# Patient Record
Sex: Female | Born: 1975 | Race: White | Hispanic: No | Marital: Married | Smoking: Never smoker
Health system: Southern US, Community
[De-identification: ages and names within clinical notes are randomized; demographics above are authoritative.]

## PROBLEM LIST (undated history)

## (undated) DIAGNOSIS — F419 Anxiety disorder, unspecified: Secondary | ICD-10-CM

## (undated) DIAGNOSIS — N3010 Interstitial cystitis (chronic) without hematuria: Secondary | ICD-10-CM

## (undated) DIAGNOSIS — R51 Headache: Secondary | ICD-10-CM

## (undated) DIAGNOSIS — E039 Hypothyroidism, unspecified: Secondary | ICD-10-CM

## (undated) HISTORY — DX: Interstitial cystitis (chronic) without hematuria: N30.10

---

## 1998-07-20 ENCOUNTER — Other Ambulatory Visit: Admission: RE | Admit: 1998-07-20 | Discharge: 1998-07-20 | Payer: Self-pay | Admitting: *Deleted

## 2000-03-07 ENCOUNTER — Other Ambulatory Visit: Admission: RE | Admit: 2000-03-07 | Discharge: 2000-03-07 | Payer: Self-pay | Admitting: Obstetrics and Gynecology

## 2000-10-09 ENCOUNTER — Inpatient Hospital Stay (HOSPITAL_COMMUNITY): Admission: RE | Admit: 2000-10-09 | Discharge: 2000-10-12 | Payer: Self-pay | Admitting: Obstetrics and Gynecology

## 2000-11-06 ENCOUNTER — Other Ambulatory Visit: Admission: RE | Admit: 2000-11-06 | Discharge: 2000-11-06 | Payer: Self-pay | Admitting: Obstetrics and Gynecology

## 2000-11-14 ENCOUNTER — Encounter: Payer: Self-pay | Admitting: Obstetrics and Gynecology

## 2000-11-14 ENCOUNTER — Encounter: Admission: RE | Admit: 2000-11-14 | Discharge: 2000-11-14 | Payer: Self-pay | Admitting: Obstetrics and Gynecology

## 2001-11-12 ENCOUNTER — Other Ambulatory Visit: Admission: RE | Admit: 2001-11-12 | Discharge: 2001-11-12 | Payer: Self-pay | Admitting: Obstetrics and Gynecology

## 2002-11-15 ENCOUNTER — Other Ambulatory Visit: Admission: RE | Admit: 2002-11-15 | Discharge: 2002-11-15 | Payer: Self-pay | Admitting: Obstetrics and Gynecology

## 2012-01-24 ENCOUNTER — Other Ambulatory Visit: Payer: Self-pay | Admitting: Obstetrics and Gynecology

## 2012-01-30 ENCOUNTER — Encounter (HOSPITAL_COMMUNITY): Payer: Self-pay | Admitting: Pharmacist

## 2012-02-01 ENCOUNTER — Encounter (HOSPITAL_COMMUNITY): Payer: Self-pay

## 2012-02-01 ENCOUNTER — Encounter (HOSPITAL_COMMUNITY)
Admission: RE | Admit: 2012-02-01 | Discharge: 2012-02-01 | Disposition: A | Discharge status: Home / Self Care | Payer: 59 | Source: Ambulatory Visit | Attending: Obstetrics and Gynecology | Admitting: Obstetrics and Gynecology

## 2012-02-01 HISTORY — DX: Headache: R51

## 2012-02-01 HISTORY — DX: Hypothyroidism, unspecified: E03.9

## 2012-02-01 HISTORY — DX: Anxiety disorder, unspecified: F41.9

## 2012-02-01 LAB — SURGICAL PCR SCREEN
MRSA, PCR: NEGATIVE
Staphylococcus aureus: NEGATIVE

## 2012-02-01 LAB — CBC
Hemoglobin: 13.8 g/dL (ref 12.0–15.0)
Platelets: 168 10*3/uL (ref 150–400)
RBC: 4.3 MIL/uL (ref 3.87–5.11)
WBC: 5.7 10*3/uL (ref 4.0–10.5)

## 2012-02-01 NOTE — Patient Instructions (Addendum)
20 Robin Zavala  02/01/2012   Your procedure is scheduled on:  02/03/12  Enter through the Main Entrance of [facility] at 1:30 PM  Pick up the phone at the desk and dial [phone].   Call this number if you have problems the morning of surgery: [phone]   Remember:   Do not eat food:After Midnight.  Do not drink clear liquids: 4 Hours before arrival.  Take these medicines the morning of surgery with A SIP OF WATER: Thyroid medication   Do not wear jewelry, make-up or nail polish.  Do not wear lotions, powders, or perfumes. You may wear deodorant.  Do not shave 48 hours prior to surgery.  Do not bring valuables to the hospital.  Contacts, dentures or bridgework may not be worn into surgery.  Leave suitcase in the car. After surgery it may be brought to your room.  For patients admitted to the hospital, checkout time is 11:00 AM the day of discharge.   Patients discharged the day of surgery will not be allowed to drive home.  Name and phone number of your driver: husband    Robin Zavala  Special Instructions: Shower using CHG 2 nights before surgery and the night before surgery.  If you shower the day of surgery use CHG.  Use special wash - you have one bottle of CHG for all showers.  You should use approximately 1/3 of the bottle for each shower.   Please read over the following fact sheets that you were given: Surgical Site Infection Prevention

## 2012-02-03 ENCOUNTER — Encounter (HOSPITAL_COMMUNITY): Payer: Self-pay

## 2012-02-03 ENCOUNTER — Encounter (HOSPITAL_COMMUNITY)
Admission: RE | Disposition: A | Discharge status: Home / Self Care | Payer: Self-pay | Source: Ambulatory Visit | Attending: Obstetrics and Gynecology

## 2012-02-03 ENCOUNTER — Ambulatory Visit (HOSPITAL_COMMUNITY)
Admission: RE | Admit: 2012-02-03 | Discharge: 2012-02-03 | Disposition: A | Discharge status: Home / Self Care | Payer: 59 | Source: Ambulatory Visit | Attending: Obstetrics and Gynecology | Admitting: Obstetrics and Gynecology

## 2012-02-03 ENCOUNTER — Encounter (HOSPITAL_COMMUNITY): Payer: Self-pay | Admitting: *Deleted

## 2012-02-03 ENCOUNTER — Ambulatory Visit (HOSPITAL_COMMUNITY): Payer: 59

## 2012-02-03 DIAGNOSIS — Z30432 Encounter for removal of intrauterine contraceptive device: Secondary | ICD-10-CM | POA: Insufficient documentation

## 2012-02-03 DIAGNOSIS — Z302 Encounter for sterilization: Secondary | ICD-10-CM | POA: Insufficient documentation

## 2012-02-03 DIAGNOSIS — Z01818 Encounter for other preprocedural examination: Secondary | ICD-10-CM | POA: Insufficient documentation

## 2012-02-03 DIAGNOSIS — Z01812 Encounter for preprocedural laboratory examination: Secondary | ICD-10-CM | POA: Insufficient documentation

## 2012-02-03 DIAGNOSIS — Z9851 Tubal ligation status: Secondary | ICD-10-CM

## 2012-02-03 HISTORY — PX: LAPAROSCOPIC TUBAL LIGATION: SHX1937

## 2012-02-03 HISTORY — PX: IUD REMOVAL: SHX5392

## 2012-02-03 LAB — HCG, SERUM, QUALITATIVE: Preg, Serum: NEGATIVE

## 2012-02-03 SURGERY — LIGATION, FALLOPIAN TUBE, LAPAROSCOPIC
Anesthesia: General

## 2012-02-03 MED ORDER — FENTANYL CITRATE 0.05 MG/ML IJ SOLN: 25.0000 ug | INTRAMUSCULAR | Status: DC | PRN

## 2012-02-03 MED ORDER — FENTANYL CITRATE 0.05 MG/ML IJ SOLN
INTRAMUSCULAR | Status: DC | PRN
  Administered 2012-02-03 (×2): 50 ug via INTRAVENOUS
  Administered 2012-02-03: 100 ug via INTRAVENOUS

## 2012-02-03 MED ORDER — OXYCODONE-ACETAMINOPHEN 5-325 MG PO TABS: 1.0000 | ORAL_TABLET | ORAL | Status: DC | PRN

## 2012-02-03 MED ORDER — NEOSTIGMINE METHYLSULFATE 1 MG/ML IJ SOLN
INTRAMUSCULAR | Status: AC
  Filled 2012-02-03: qty 1

## 2012-02-03 MED ORDER — GLYCOPYRROLATE 0.2 MG/ML IJ SOLN
INTRAMUSCULAR | Status: AC
  Filled 2012-02-03: qty 3

## 2012-02-03 MED ORDER — KETOROLAC TROMETHAMINE 30 MG/ML IJ SOLN: 15.0000 mg | Freq: Once | INTRAMUSCULAR | Status: DC | PRN

## 2012-02-03 MED ORDER — ROCURONIUM BROMIDE 100 MG/10ML IV SOLN
INTRAVENOUS | Status: DC | PRN
  Administered 2012-02-03: 30 mg via INTRAVENOUS

## 2012-02-03 MED ORDER — PROPOFOL 10 MG/ML IV EMUL
INTRAVENOUS | Status: DC | PRN
  Administered 2012-02-03: 180 mg via INTRAVENOUS

## 2012-02-03 MED ORDER — BUPIVACAINE HCL (PF) 0.25 % IJ SOLN
INTRAMUSCULAR | Status: DC | PRN
  Administered 2012-02-03: 10 mL

## 2012-02-03 MED ORDER — GLYCOPYRROLATE 0.2 MG/ML IJ SOLN
INTRAMUSCULAR | Status: DC | PRN
  Administered 2012-02-03: 0.6 mg via INTRAVENOUS

## 2012-02-03 MED ORDER — MEPERIDINE HCL 25 MG/ML IJ SOLN: 6.2500 mg | INTRAMUSCULAR | Status: DC | PRN

## 2012-02-03 MED ORDER — MIDAZOLAM HCL 5 MG/5ML IJ SOLN
INTRAMUSCULAR | Status: DC | PRN
  Administered 2012-02-03: 2 mg via INTRAVENOUS

## 2012-02-03 MED ORDER — FENTANYL CITRATE 0.05 MG/ML IJ SOLN
INTRAMUSCULAR | Status: AC
  Filled 2012-02-03: qty 5

## 2012-02-03 MED ORDER — CEFAZOLIN SODIUM-DEXTROSE 2-3 GM-% IV SOLR
INTRAVENOUS | Status: AC
  Filled 2012-02-03: qty 50

## 2012-02-03 MED ORDER — MIDAZOLAM HCL 2 MG/2ML IJ SOLN
INTRAMUSCULAR | Status: AC
  Filled 2012-02-03: qty 2

## 2012-02-03 MED ORDER — BUPIVACAINE HCL (PF) 0.25 % IJ SOLN
INTRAMUSCULAR | Status: AC
  Filled 2012-02-03: qty 30

## 2012-02-03 MED ORDER — LACTATED RINGERS IV SOLN
INTRAVENOUS | Status: DC
  Administered 2012-02-03: 50 mL/h via INTRAVENOUS

## 2012-02-03 MED ORDER — ONDANSETRON HCL 4 MG/2ML IJ SOLN: 4.0000 mg | Freq: Once | INTRAMUSCULAR | Status: DC | PRN

## 2012-02-03 MED ORDER — LIDOCAINE HCL (CARDIAC) 20 MG/ML IV SOLN
INTRAVENOUS | Status: DC | PRN
  Administered 2012-02-03: 50 mg via INTRAVENOUS

## 2012-02-03 MED ORDER — ONDANSETRON HCL 4 MG/2ML IJ SOLN
INTRAMUSCULAR | Status: AC
  Filled 2012-02-03: qty 2

## 2012-02-03 MED ORDER — PROPOFOL 10 MG/ML IV EMUL
INTRAVENOUS | Status: AC
  Filled 2012-02-03: qty 20

## 2012-02-03 MED ORDER — KETOROLAC TROMETHAMINE 30 MG/ML IJ SOLN
INTRAMUSCULAR | Status: DC | PRN
  Administered 2012-02-03: 30 mg via INTRAVENOUS

## 2012-02-03 MED ORDER — ONDANSETRON HCL 4 MG/2ML IJ SOLN
INTRAMUSCULAR | Status: DC | PRN
  Administered 2012-02-03: 4 mg via INTRAVENOUS

## 2012-02-03 MED ORDER — ROCURONIUM BROMIDE 50 MG/5ML IV SOLN
INTRAVENOUS | Status: AC
  Filled 2012-02-03: qty 1

## 2012-02-03 MED ORDER — NEOSTIGMINE METHYLSULFATE 1 MG/ML IJ SOLN
INTRAMUSCULAR | Status: DC | PRN
  Administered 2012-02-03: 3 mg via INTRAVENOUS

## 2012-02-03 MED ORDER — CEFAZOLIN SODIUM-DEXTROSE 2-3 GM-% IV SOLR
2.0000 g | INTRAVENOUS | Status: AC
  Administered 2012-02-03: 2 g via INTRAVENOUS

## 2012-02-03 MED ORDER — KETOROLAC TROMETHAMINE 30 MG/ML IJ SOLN
INTRAMUSCULAR | Status: AC
  Filled 2012-02-03: qty 1

## 2012-02-03 MED ORDER — LIDOCAINE HCL (CARDIAC) 20 MG/ML IV SOLN
INTRAVENOUS | Status: AC
  Filled 2012-02-03: qty 5

## 2012-02-03 SURGICAL SUPPLY — 15 items
CATH ROBINSON RED A/P 16FR (CATHETERS) ×3 IMPLANT
CLOTH BEACON ORANGE TIMEOUT ST (SAFETY) ×3 IMPLANT
DERMABOND ADVANCED (GAUZE/BANDAGES/DRESSINGS) ×1
DERMABOND ADVANCED .7 DNX12 (GAUZE/BANDAGES/DRESSINGS) ×2 IMPLANT
GLOVE BIO SURGEON STRL SZ7.5 (GLOVE) ×6 IMPLANT
GOWN PREVENTION PLUS LG XLONG (DISPOSABLE) ×3 IMPLANT
GOWN PREVENTION PLUS XLARGE (GOWN DISPOSABLE) ×3 IMPLANT
PACK LAPAROSCOPY BASIN (CUSTOM PROCEDURE TRAY) ×3 IMPLANT
SOLUTION ELECTROLUBE (MISCELLANEOUS) ×3 IMPLANT
SUT VICRYL 0 UR6 27IN ABS (SUTURE) ×3 IMPLANT
SUT VICRYL 4-0 PS2 18IN ABS (SUTURE) IMPLANT
TOWEL OR 17X24 6PK STRL BLUE (TOWEL DISPOSABLE) ×6 IMPLANT
TROCAR Z-THREAD BLADED 11X100M (TROCAR) ×3 IMPLANT
WARMER LAPAROSCOPE (MISCELLANEOUS) ×3 IMPLANT
WATER STERILE IRR 1000ML POUR (IV SOLUTION) ×3 IMPLANT

## 2012-02-03 NOTE — Progress Notes (Signed)
Patient ID: GAY MONCIVAIS, female   DOB: [birth year], 37 y.o.   MRN: [id number] Patient seen and examined. Consent witnessed and signed. No changes noted. Update completed.

## 2012-02-03 NOTE — Op Note (Signed)
02/03/2012  3:16 PM  PATIENT:  Robin Zavala  37 y.o. female  PRE-OPERATIVE DIAGNOSIS:  Desires Sterilization [phone], [phone]  POST-OPERATIVE DIAGNOSIS:  Desires Sterilization [phone], [phone]  PROCEDURE:  Procedure(s): LAPAROSCOPIC TUBAL LIGATION INTRAUTERINE DEVICE (IUD) REMOVAL  SURGEON:  Surgeon(s): Lenoard Aden, MD  ASSISTANTS: none   ANESTHESIA:   local and general  ESTIMATED BLOOD LOSS: * No blood loss amount entered *   DRAINS: none   LOCAL MEDICATIONS USED:  MARCAINE     SPECIMEN:  Source of Specimen:  iud  DISPOSITION OF SPECIMEN:  N/A  COUNTS:  YES  DICTATION #: [id number]  PLAN OF CARE: DC home  PATIENT DISPOSITION:  PACU - hemodynamically stable.

## 2012-02-03 NOTE — Anesthesia Preprocedure Evaluation (Signed)
Anesthesia Evaluation  Patient identified by MRN, date of birth, ID band Patient awake    Reviewed: Allergy & Precautions, H&P , Patient's Chart, lab work & pertinent test results  Airway Mallampati: I TM Distance: >3 FB Neck ROM: full    Dental No notable dental hx. (+) Teeth Intact   Pulmonary neg pulmonary ROS,    Pulmonary exam normal       Cardiovascular negative cardio ROS      Neuro/Psych  Headaches, negative psych ROS   GI/Hepatic negative GI ROS, Neg liver ROS,   Endo/Other  negative endocrine ROSHypothyroidism   Renal/GU negative Renal ROS  negative genitourinary   Musculoskeletal negative musculoskeletal ROS (+)   Abdominal Normal abdominal exam  (+)   Peds negative pediatric ROS (+)  Hematology negative hematology ROS (+)   Anesthesia Other Findings   Reproductive/Obstetrics negative OB ROS                           Anesthesia Physical Anesthesia Plan  ASA: II  Anesthesia Plan: General   Post-op Pain Management:    Induction: Intravenous  Airway Management Planned: Oral ETT  Additional Equipment:   Intra-op Plan:   Post-operative Plan: Extubation in OR  Informed Consent: I have reviewed the patients History and Physical, chart, labs and discussed the procedure including the risks, benefits and alternatives for the proposed anesthesia with the patient or authorized representative who has indicated his/her understanding and acceptance.   Dental Advisory Given  Plan Discussed with: CRNA and Surgeon  Anesthesia Plan Comments:         Anesthesia Quick Evaluation

## 2012-02-03 NOTE — H&P (Signed)
NAME:  Robin Zavala, Robin Zavala NO.:  MEDICAL RECORD NO.:  [id number]  LOCATION:                                 FACILITY:  PHYSICIAN:  Lenoard Aden, M.D.DATE OF BIRTH:  10-07-1975  DATE OF ADMISSION: DATE OF DISCHARGE:                             HISTORY & PHYSICAL   CHIEF COMPLAINT:  Desire for elective sterilization and IUD removal. She is a 37 year old white female G2, P2, who desires elective sterilization.  MEDICATIONS:  Macrobid, boric acid, Celexa, Mirena, thyroid supplementation, Macrobid, Topamax, and multivitamin.  ALLERGIES:  SULFA.  FAMILY HISTORY:  She has a family history of diabetes.  PAST MEDICAL HISTORY:  She has a previous history of a C-section in 1998 and a previous vaginal delivery.  PAST SURGICAL HISTORY:  Noncontributory otherwise.  PHYSICAL EXAMINATION:  VITAL SIGNS:  She has a weight of 127 pounds, height of 64 inches. HEENT:  Normal. NECK:  Supple.  Full range of motion. LUNGS:  Clear. HEART:  Regular rhythm. ABDOMEN:  Soft, nontender. PELVIC:  Anteflexed uterus and no adnexal masses. EXTREMITIES:  There are no cords. NEUROLOGIC:  Nonfocal. SKIN:  Intact.  IMPRESSION: 1. Desire for elective sterilization. 2. Intrauterine device for removal.  PLAN: 1. Proceed with IUD removal, laparoscopic tubal ligation. 2. The patient declines replacement of Mirena or elective     sterilization via the Essure method.  Risks of     anesthesia, infection, bleeding, injury to abdominal organs, need     for repair was discussed, delayed versus immediate complications to     include bowel and bladder injury noted.  The patient acknowledges     and wishes to proceed.     Lenoard Aden, M.D.     RJT/MEDQ  D:  02/02/2012  T:  02/02/2012  Job:  [id number]

## 2012-02-03 NOTE — Anesthesia Postprocedure Evaluation (Signed)
Anesthesia Post Note  Patient: Robin Zavala  Procedure(s) Performed: Procedure(s) (LRB): LAPAROSCOPIC TUBAL LIGATION (Bilateral) INTRAUTERINE DEVICE (IUD) REMOVAL (N/A)  Anesthesia type: General  Patient location: PACU  Post pain: Pain level controlled  Post assessment: Post-op Vital signs reviewed  Last Vitals:  Filed Vitals:   02/03/12 1337  BP: 109/72  Pulse: 72  Temp: 36.8 C  Resp: 18    Post vital signs: Reviewed  Level of consciousness: sedate Complications: No apparent anesthesia complications

## 2012-02-03 NOTE — Transfer of Care (Signed)
Immediate Anesthesia Transfer of Care Note  Patient: Robin Zavala  Procedure(s) Performed: Procedure(s) (LRB) with comments: LAPAROSCOPIC TUBAL LIGATION (Bilateral) INTRAUTERINE DEVICE (IUD) REMOVAL (N/A)  Patient Location: PACU  Anesthesia Type:General  Level of Consciousness: awake, alert  and oriented  Airway & Oxygen Therapy: Patient Spontanous Breathing and Patient connected to nasal cannula oxygen  Post-op Assessment: Report given to PACU RN and Post -op Vital signs reviewed and stable  Post vital signs: Reviewed and stable  Complications: No apparent anesthesia complications

## 2012-02-04 NOTE — Op Note (Signed)
NAMEMEYA, CLUTTER NO.:  [id number]  MEDICAL RECORD NO.:  [id number]  LOCATION:  [facility]                          FACILITY:  [facility]  PHYSICIAN:  Lenoard Aden, M.D.DATE OF BIRTH:  1975/07/12  DATE OF PROCEDURE: DATE OF DISCHARGE:  02/03/2012                              OPERATIVE REPORT   DESCRIPTION OF PROCEDURE:  After being apprised of risks of anesthesia, infection, bleeding, injury to abdominal organs, possible need for repair, failure risk of tubal ligation 5 to 10 per 1000.  The patient was brought to the operating room where she was administered general anesthetic without complications, prepped and draped in usual sterile fashion, catheterized until the bladder was empty.  Exam under anesthesia reveals an anteflexed uterus.  No adnexal masses.  IUD was removed without difficulty.  Removed intact.  Hulka tenaculum was placed per vagina without difficulty.  Infraumbilical incision was made with a scalpel.  Veress needle placed opening pressure 0, 3 L of CO2 insufflated without difficulty.  Trocar was placed atraumatically. Visualization reveals atraumatic trocar entry.  Normal liver, gallbladder, but normal appendiceal area.  Normal uterus, anterior posterior cul-de-sac.  Bilateral normal tubes and ovaries.  At this time, the Kleppinger bipolar cautery was entered through the operative port of the scope and the right tube was traced out to the fimbriated end and ampullary isthmic portion of tube was cauterized in 3 contiguous areas.  The same procedure was done to the left after being traced out the fimbriated end.  Both tubal lumens were then visualized after dividing both tubes using hook scissors.  Good hemostasis was noted. CO2 was released and revisualization reveals good hemostasis.  At this time, procedure was terminated.  All instruments removed.  CO2 was released.  Incision was closed with 0 Vicryl, and Dermabond.  The Hulka tenaculum removed  from the vagina.  The patient tolerated the procedure well, awakened and transferred to recovery room in good condition.     Lenoard Aden, M.D.     RJT/MEDQ  D:  02/03/2012  T:  02/04/2012  Job:  [id number]

## 2012-02-06 ENCOUNTER — Encounter (HOSPITAL_COMMUNITY): Payer: Self-pay | Admitting: Obstetrics and Gynecology

## 2013-08-21 ENCOUNTER — Other Ambulatory Visit: Payer: Self-pay | Admitting: Family Medicine

## 2013-08-21 ENCOUNTER — Ambulatory Visit
Admission: RE | Admit: 2013-08-21 | Discharge: 2013-08-21 | Disposition: A | Discharge status: Home / Self Care | Payer: 59 | Source: Ambulatory Visit | Attending: Family Medicine | Admitting: Family Medicine

## 2013-08-21 DIAGNOSIS — R609 Edema, unspecified: Secondary | ICD-10-CM

## 2013-08-21 DIAGNOSIS — R52 Pain, unspecified: Secondary | ICD-10-CM

## 2013-08-22 ENCOUNTER — Other Ambulatory Visit (HOSPITAL_COMMUNITY): Payer: Self-pay | Admitting: Family Medicine

## 2013-08-22 DIAGNOSIS — M79671 Pain in right foot: Secondary | ICD-10-CM

## 2013-08-30 ENCOUNTER — Encounter (HOSPITAL_COMMUNITY): Payer: 59

## 2013-08-30 ENCOUNTER — Ambulatory Visit (HOSPITAL_COMMUNITY): Payer: 59

## 2013-09-05 ENCOUNTER — Ambulatory Visit (INDEPENDENT_AMBULATORY_CARE_PROVIDER_SITE_OTHER): Payer: 59 | Admitting: Podiatry

## 2013-09-05 ENCOUNTER — Encounter: Payer: Self-pay | Admitting: Podiatry

## 2013-09-05 ENCOUNTER — Ambulatory Visit (INDEPENDENT_AMBULATORY_CARE_PROVIDER_SITE_OTHER): Payer: 59

## 2013-09-05 VITALS — BP 132/85 | HR 83 | Resp 12 | Ht 63.5 in | Wt 162.0 lb

## 2013-09-05 DIAGNOSIS — G5761 Lesion of plantar nerve, right lower limb: Secondary | ICD-10-CM

## 2013-09-05 DIAGNOSIS — Z4789 Encounter for other orthopedic aftercare: Secondary | ICD-10-CM

## 2013-09-05 DIAGNOSIS — G5760 Lesion of plantar nerve, unspecified lower limb: Secondary | ICD-10-CM

## 2013-09-05 DIAGNOSIS — M79609 Pain in unspecified limb: Secondary | ICD-10-CM

## 2013-09-05 DIAGNOSIS — M84374D Stress fracture, right foot, subsequent encounter for fracture with routine healing: Secondary | ICD-10-CM

## 2013-09-05 DIAGNOSIS — M79671 Pain in right foot: Secondary | ICD-10-CM

## 2013-09-05 NOTE — Progress Notes (Signed)
   Subjective:    Patient ID: Robin Zavala, female    DOB: 07/08/1975, 38 y.o.   MRN: [id number]  HPI Comments: Pt complains of swelling and aching across the entire foot especially the right 1st metatarsal area when standing, for 3 - 4 weeks.  Pt states x-rayed by Dr. Maurice Small about 10 days ago, ordered to wear athletic shoes and stay out of flip-flops.  Pt states has nerve burning pain and along plantar right 4th intermetatarsal space to heel, 1 year.  Foot Pain      Review of Systems  All other systems reviewed and are negative.      Objective:   Physical Exam        Assessment & Plan:

## 2013-09-05 NOTE — Progress Notes (Signed)
Subjective:     Patient ID: Robin Zavala, female   DOB: 03-20-1975, 38 y.o.   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      Objective:   Physical Exam  Nursing note and vitals reviewed. Constitutional: She is oriented to person, place, and time.  Cardiovascular: Intact distal pulses.   Musculoskeletal: Normal range of motion.  Neurological: She is oriented to person, place, and time.  Skin: Skin is warm.   neurovascular status is intact with muscle strength adequate and range of motion subtalar midtarsal joint within normal limits. Patient is noted to have discomfort in the dorsum of the right foot around the second metatarsal shaft with inflammation and fluid and edema noted and possible Mulder sign positive third interspace right foot. Patient is well oriented and has a normal arch height     Assessment:     Stress fracture of the right second metatarsal most likely along with possible neuroma symptoms right    Plan:     H&P and x-rays reviewed and condition discussed. At this time I did placed in a short air fracture walker with instructions on usage and we'll reevaluate and rex-ray again in 3 weeks

## 2013-09-26 ENCOUNTER — Ambulatory Visit (INDEPENDENT_AMBULATORY_CARE_PROVIDER_SITE_OTHER): Payer: 59

## 2013-09-26 ENCOUNTER — Ambulatory Visit (INDEPENDENT_AMBULATORY_CARE_PROVIDER_SITE_OTHER): Payer: 59 | Admitting: Podiatry

## 2013-09-26 ENCOUNTER — Encounter: Payer: Self-pay | Admitting: Podiatry

## 2013-09-26 VITALS — BP 133/85 | HR 97 | Resp 16

## 2013-09-26 DIAGNOSIS — M8448XD Pathological fracture, other site, subsequent encounter for fracture with routine healing: Secondary | ICD-10-CM

## 2013-09-26 DIAGNOSIS — M8430XD Stress fracture, unspecified site, subsequent encounter for fracture with routine healing: Secondary | ICD-10-CM

## 2013-09-26 DIAGNOSIS — G5761 Lesion of plantar nerve, right lower limb: Secondary | ICD-10-CM

## 2013-09-27 NOTE — Progress Notes (Signed)
Subjective:     Patient ID: Atilano Median, female   DOB: 27-Nov-1975, 38 y.o.   MRN: [id number]  HPI patient states that I'm improving but my foot still hurts on top   Review of Systems     Objective:   Physical Exam Neurovascular status intact with edema around the second metatarsal shaft right that still present but improved from previously with pain with palpation    Assessment:     Stress fracture that is healing right foot but still going through process    Plan:     X-rays reviewed with patient and discussed continued reduced activity ice therapy and gradual return to normal shoe gear over the next several weeks. I do not want her doing any activities where she has to do jumping or anything that could traumatized the area. Reappoint to recheck

## 2013-10-10 ENCOUNTER — Encounter: Payer: Self-pay | Admitting: Podiatry

## 2013-10-10 ENCOUNTER — Ambulatory Visit (INDEPENDENT_AMBULATORY_CARE_PROVIDER_SITE_OTHER): Payer: 59 | Admitting: Podiatry

## 2013-10-10 VITALS — BP 120/78 | HR 93 | Resp 16

## 2013-10-10 DIAGNOSIS — M8430XD Stress fracture, unspecified site, subsequent encounter for fracture with routine healing: Secondary | ICD-10-CM

## 2013-10-10 DIAGNOSIS — G5761 Lesion of plantar nerve, right lower limb: Secondary | ICD-10-CM

## 2013-10-10 NOTE — Progress Notes (Signed)
Subjective:     Patient ID: Robin Zavala, female   DOB: 1975/04/29, 38 y.o.   MRN: [id number]  HPI patient states my right foot is starting to feel better and the area but the injection his is improving with some discomfort but not the same   Review of Systems     Objective:   Physical Exam Neurovascular status intact with minimal discomfort to palpation second metatarsal shaft right or palpation to the third interspace right    Assessment:     Stress fracture improving right along with neuroma symptoms improving    Plan:     Reviewed condition and at this time allowed patient to return to normal activity with possibilities in the future for continued neuro lysis injections

## 2014-05-22 ENCOUNTER — Encounter: Payer: Self-pay | Admitting: Podiatry

## 2014-05-22 ENCOUNTER — Ambulatory Visit (INDEPENDENT_AMBULATORY_CARE_PROVIDER_SITE_OTHER): Payer: 59 | Admitting: Podiatry

## 2014-05-22 ENCOUNTER — Ambulatory Visit (INDEPENDENT_AMBULATORY_CARE_PROVIDER_SITE_OTHER): Payer: 59

## 2014-05-22 VITALS — BP 121/91 | HR 79 | Resp 12 | Ht 63.5 in | Wt 169.0 lb

## 2014-05-22 DIAGNOSIS — R200 Anesthesia of skin: Secondary | ICD-10-CM

## 2014-05-22 DIAGNOSIS — S92001A Unspecified fracture of right calcaneus, initial encounter for closed fracture: Secondary | ICD-10-CM

## 2014-05-22 DIAGNOSIS — M84374D Stress fracture, right foot, subsequent encounter for fracture with routine healing: Secondary | ICD-10-CM | POA: Diagnosis not present

## 2014-05-22 NOTE — Progress Notes (Signed)
   Subjective:    Patient ID: Robin Zavala, female    DOB: 1975-07-23, 39 y.o.   MRN: [id number]  HPI    Review of Systems  Genitourinary:       Interstitial cystitis  All other systems reviewed and are negative.      Objective:   Physical Exam        Assessment & Plan:

## 2014-05-23 NOTE — Progress Notes (Signed)
Subjective:     Patient ID: Robin Zavala, female   DOB: March 13, 1975, 39 y.o.   MRN: [id number]  HPI patient states I'm developing pain on top of my right foot and a reminds me of my previous stress fracture. Has been present for about 3 weeks with no history of injury   Review of Systems     Objective:   Physical Exam Neurovascular status intact muscle strength adequate with range of motion within normal limits. Patient has mild edema in the dorsum of the right foot around the third and fourth metatarsal with some swelling just proximal to the metatarsophalangeal joint. No other pathology noted negative Homans sign noted    Assessment:     Possible stress fracture or stress riser occurring third and fourth metatarsal with history of this type of injury    Plan:     H&P and condition reviewed with patient. At this point I have recommended immobilization with the boot she has at home ice therapy and it should resolve quickly. If symptoms persist she is to let us know in the next few weeks and I reviewed x-rays with her today

## 2015-12-03 IMAGING — CR DG FOOT COMPLETE 3+V*R*
3 series · 3 of 3 positions shown · non-contrast
Comparison: None.

CLINICAL DATA: Right-sided foot pain dorsally for several weeks
without trauma.

EXAM:
RIGHT FOOT COMPLETE - 3+ VIEW

[t foot ap right]
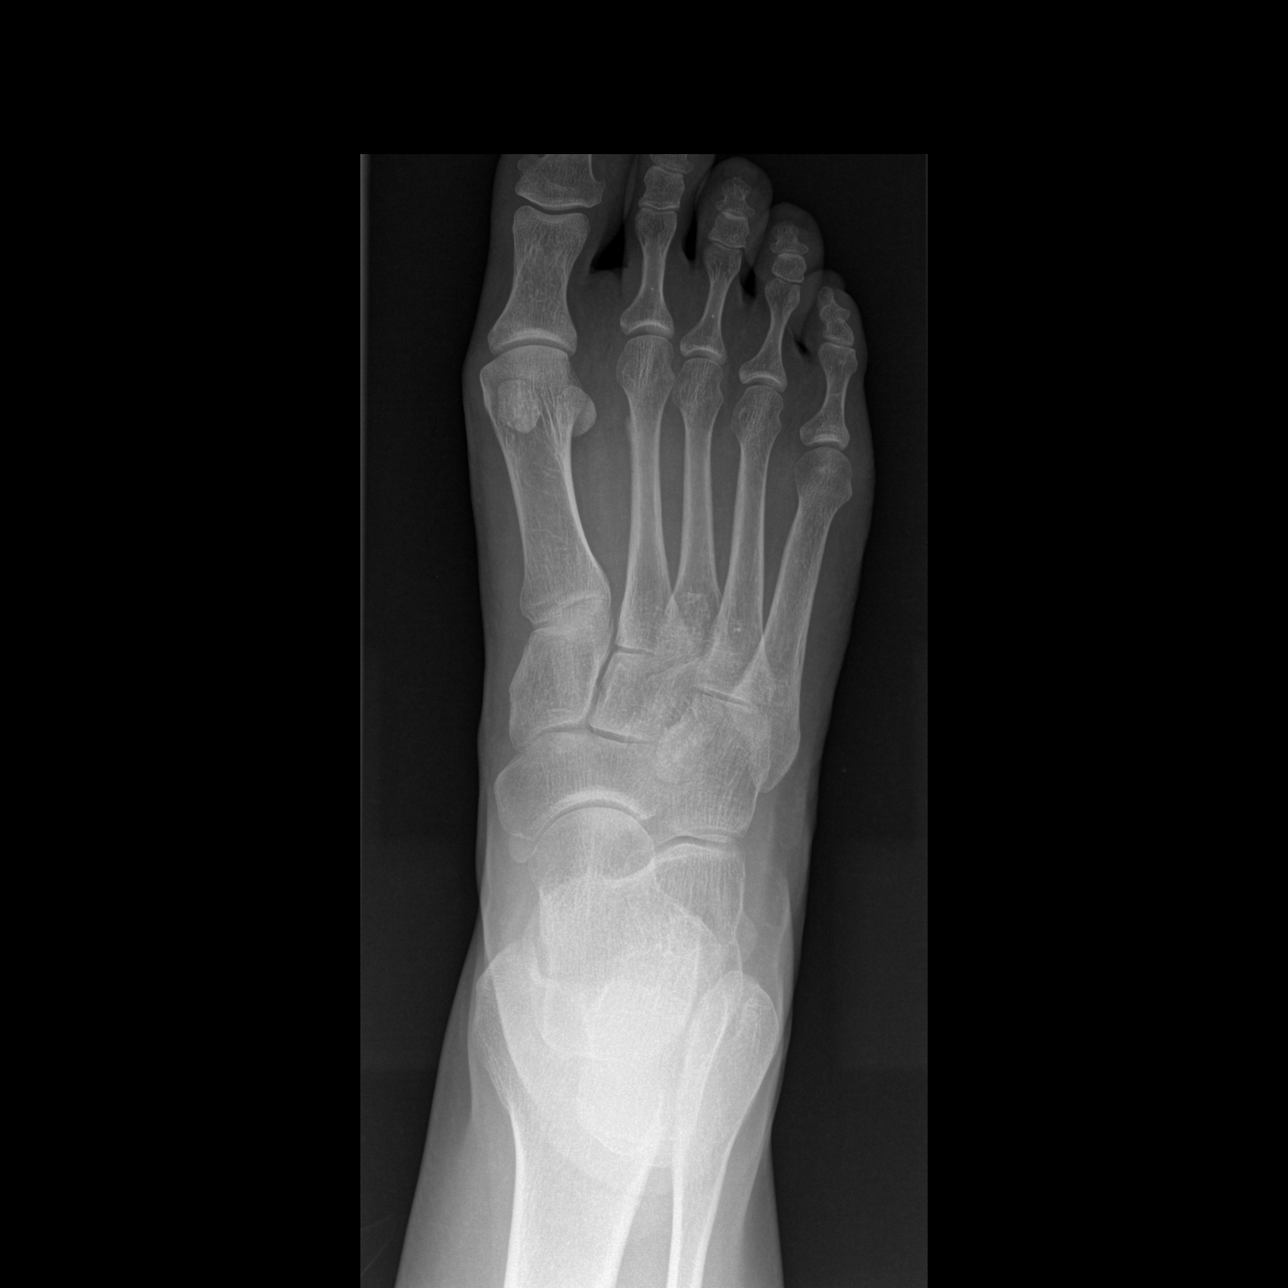

[t foot oblique right]
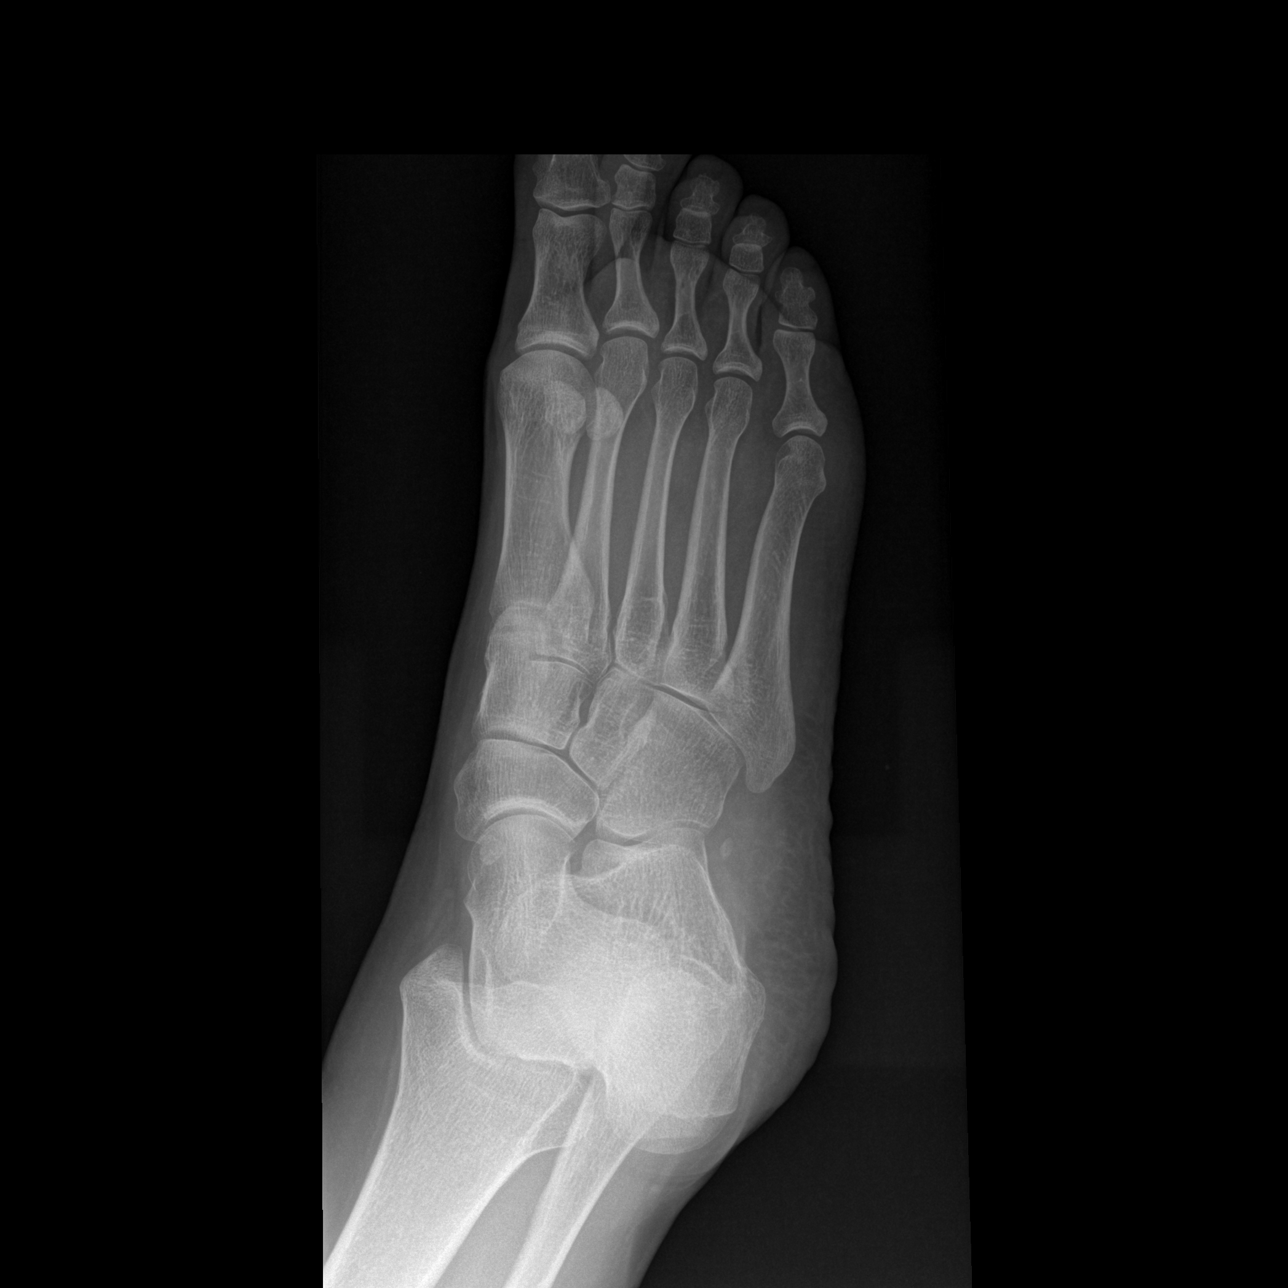

[t foot lat right]
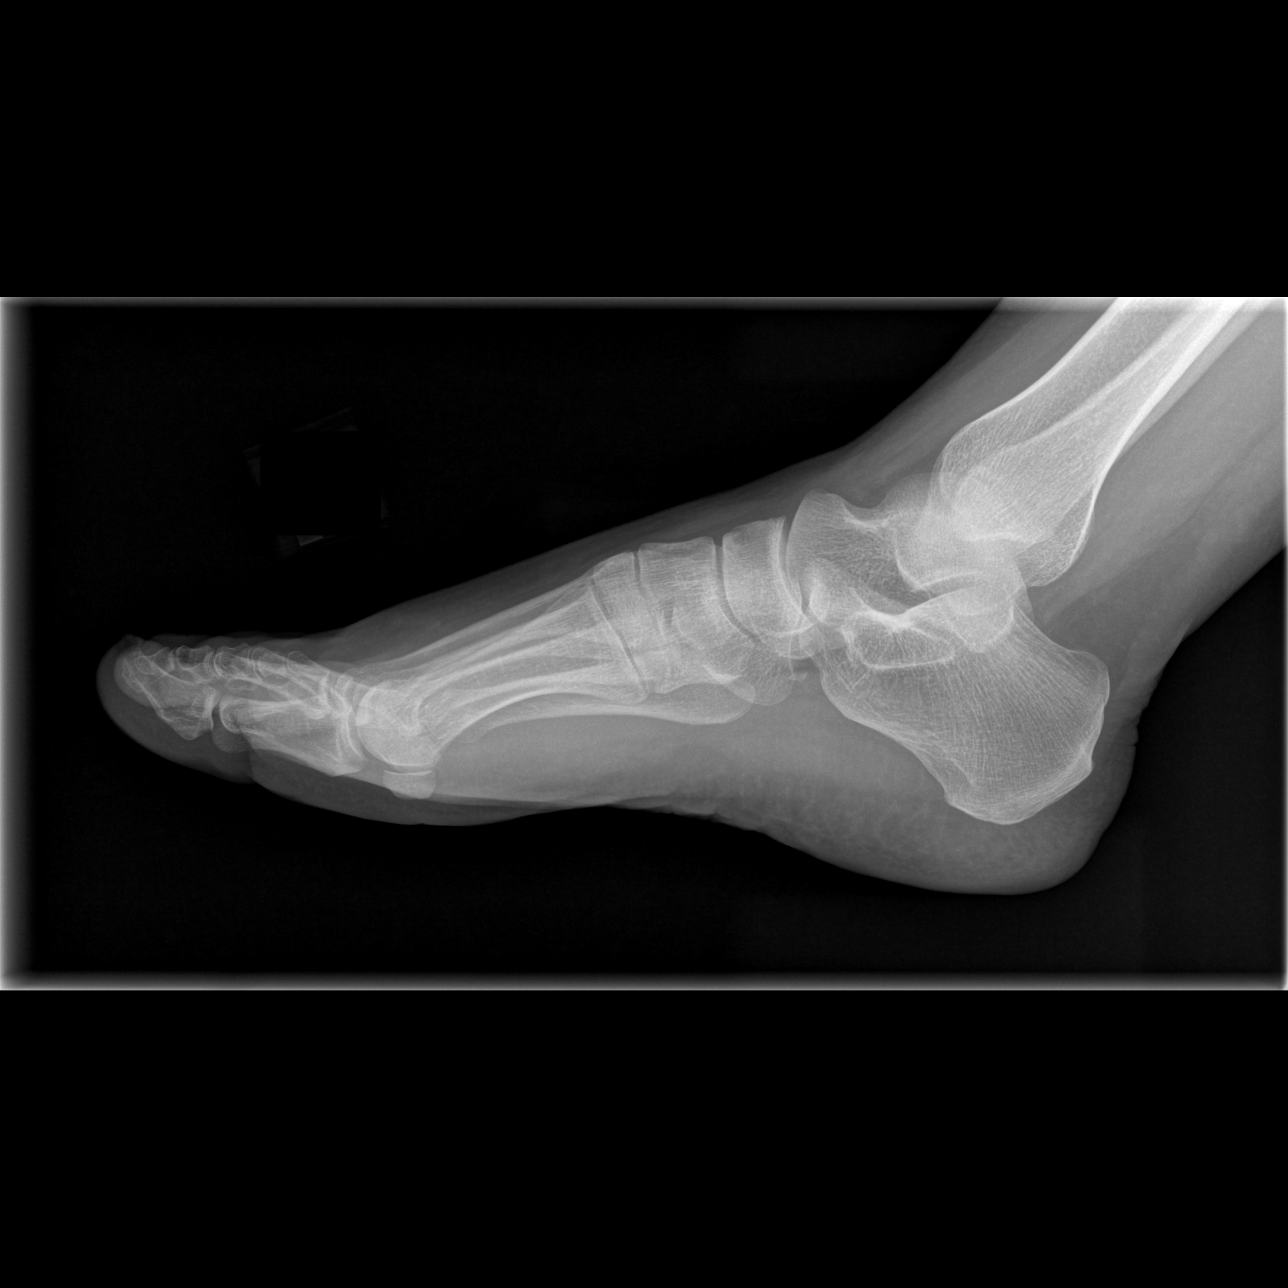

[3 of 3 positions shown; findings below may reference images not displayed]

FINDINGS: No acute fracture or dislocation. No callus deposition or periosteal
reaction. No focal osseous lesion.
IMPRESSION: No acute osseous abnormality.

## 2016-03-10 DIAGNOSIS — R51 Headache: Secondary | ICD-10-CM | POA: Diagnosis not present

## 2016-11-07 DIAGNOSIS — Z0000 Encounter for general adult medical examination without abnormal findings: Secondary | ICD-10-CM | POA: Diagnosis not present

## 2016-11-07 DIAGNOSIS — Z13220 Encounter for screening for lipoid disorders: Secondary | ICD-10-CM | POA: Diagnosis not present

## 2016-11-07 DIAGNOSIS — Z131 Encounter for screening for diabetes mellitus: Secondary | ICD-10-CM | POA: Diagnosis not present

## 2016-11-07 DIAGNOSIS — Z130 Encounter for screening for diseases of the blood and blood-forming organs and certain disorders involving the immune mechanism: Secondary | ICD-10-CM | POA: Diagnosis not present

## 2016-11-07 DIAGNOSIS — Z01419 Encounter for gynecological examination (general) (routine) without abnormal findings: Secondary | ICD-10-CM | POA: Diagnosis not present

## 2017-01-12 DIAGNOSIS — E039 Hypothyroidism, unspecified: Secondary | ICD-10-CM | POA: Diagnosis not present

## 2017-01-12 DIAGNOSIS — D649 Anemia, unspecified: Secondary | ICD-10-CM | POA: Diagnosis not present

## 2017-05-10 DIAGNOSIS — J329 Chronic sinusitis, unspecified: Secondary | ICD-10-CM | POA: Diagnosis not present

## 2018-01-17 DIAGNOSIS — Z0000 Encounter for general adult medical examination without abnormal findings: Secondary | ICD-10-CM | POA: Diagnosis not present

## 2018-01-17 DIAGNOSIS — Z6828 Body mass index (BMI) 28.0-28.9, adult: Secondary | ICD-10-CM | POA: Diagnosis not present

## 2018-01-17 DIAGNOSIS — Z13220 Encounter for screening for lipoid disorders: Secondary | ICD-10-CM | POA: Diagnosis not present

## 2018-01-17 DIAGNOSIS — Z01419 Encounter for gynecological examination (general) (routine) without abnormal findings: Secondary | ICD-10-CM | POA: Diagnosis not present

## 2018-01-17 DIAGNOSIS — Z1231 Encounter for screening mammogram for malignant neoplasm of breast: Secondary | ICD-10-CM | POA: Diagnosis not present

## 2019-04-03 DIAGNOSIS — Z6830 Body mass index (BMI) 30.0-30.9, adult: Secondary | ICD-10-CM | POA: Diagnosis not present

## 2019-04-03 DIAGNOSIS — Z01419 Encounter for gynecological examination (general) (routine) without abnormal findings: Secondary | ICD-10-CM | POA: Diagnosis not present

## 2019-04-03 DIAGNOSIS — Z1231 Encounter for screening mammogram for malignant neoplasm of breast: Secondary | ICD-10-CM | POA: Diagnosis not present

## 2019-04-11 DIAGNOSIS — Z13220 Encounter for screening for lipoid disorders: Secondary | ICD-10-CM | POA: Diagnosis not present

## 2019-04-11 DIAGNOSIS — E039 Hypothyroidism, unspecified: Secondary | ICD-10-CM | POA: Diagnosis not present

## 2019-04-11 DIAGNOSIS — Z1321 Encounter for screening for nutritional disorder: Secondary | ICD-10-CM | POA: Diagnosis not present

## 2019-04-11 DIAGNOSIS — Z130 Encounter for screening for diseases of the blood and blood-forming organs and certain disorders involving the immune mechanism: Secondary | ICD-10-CM | POA: Diagnosis not present

## 2019-04-11 DIAGNOSIS — Z0000 Encounter for general adult medical examination without abnormal findings: Secondary | ICD-10-CM | POA: Diagnosis not present

## 2019-04-11 DIAGNOSIS — Z131 Encounter for screening for diabetes mellitus: Secondary | ICD-10-CM | POA: Diagnosis not present

## 2019-08-21 DIAGNOSIS — Z1152 Encounter for screening for COVID-19: Secondary | ICD-10-CM | POA: Diagnosis not present

## 2019-08-21 DIAGNOSIS — G4489 Other headache syndrome: Secondary | ICD-10-CM | POA: Diagnosis not present

## 2020-01-13 DIAGNOSIS — U071 COVID-19: Secondary | ICD-10-CM | POA: Diagnosis not present

## 2020-01-13 DIAGNOSIS — Z20822 Contact with and (suspected) exposure to covid-19: Secondary | ICD-10-CM | POA: Diagnosis not present

## 2020-02-21 DIAGNOSIS — R1013 Epigastric pain: Secondary | ICD-10-CM | POA: Diagnosis not present

## 2020-02-21 DIAGNOSIS — Z1211 Encounter for screening for malignant neoplasm of colon: Secondary | ICD-10-CM | POA: Diagnosis not present

## 2020-02-21 DIAGNOSIS — R110 Nausea: Secondary | ICD-10-CM | POA: Diagnosis not present

## 2020-02-27 ENCOUNTER — Other Ambulatory Visit: Payer: Self-pay | Admitting: Physician Assistant

## 2020-02-27 DIAGNOSIS — R1013 Epigastric pain: Secondary | ICD-10-CM

## 2020-02-27 DIAGNOSIS — R110 Nausea: Secondary | ICD-10-CM

## 2020-03-02 ENCOUNTER — Other Ambulatory Visit: Payer: Self-pay | Admitting: Family Medicine

## 2020-03-02 ENCOUNTER — Ambulatory Visit
Admission: RE | Admit: 2020-03-02 | Discharge: 2020-03-02 | Disposition: A | Discharge status: Home / Self Care | Payer: BC Managed Care – PPO | Source: Ambulatory Visit | Attending: Family Medicine | Admitting: Family Medicine

## 2020-03-02 DIAGNOSIS — M25571 Pain in right ankle and joints of right foot: Secondary | ICD-10-CM

## 2020-03-02 DIAGNOSIS — M7989 Other specified soft tissue disorders: Secondary | ICD-10-CM | POA: Diagnosis not present

## 2020-03-02 DIAGNOSIS — S92001A Unspecified fracture of right calcaneus, initial encounter for closed fracture: Secondary | ICD-10-CM | POA: Diagnosis not present

## 2020-03-09 ENCOUNTER — Ambulatory Visit: Payer: BC Managed Care – PPO | Admitting: Podiatry

## 2020-03-09 ENCOUNTER — Ambulatory Visit (INDEPENDENT_AMBULATORY_CARE_PROVIDER_SITE_OTHER): Payer: BC Managed Care – PPO

## 2020-03-09 ENCOUNTER — Other Ambulatory Visit: Payer: Self-pay

## 2020-03-09 ENCOUNTER — Encounter: Payer: Self-pay | Admitting: Podiatry

## 2020-03-09 DIAGNOSIS — R600 Localized edema: Secondary | ICD-10-CM | POA: Diagnosis not present

## 2020-03-09 DIAGNOSIS — S99921A Unspecified injury of right foot, initial encounter: Secondary | ICD-10-CM

## 2020-03-09 DIAGNOSIS — S93491A Sprain of other ligament of right ankle, initial encounter: Secondary | ICD-10-CM

## 2020-03-09 NOTE — Progress Notes (Signed)
Subjective:   Patient ID: Robin Zavala, female   DOB: 45 y.o.   MRN: [id number]   HPI Patient presents stating that she twisted her ankle severely around a week ago and she is having a sharp pain in the outside of her ankle and she is unable to bear weight on significantly and using a scooter.  States she has a lot of swelling and states that she was given a tentative diagnosis of fracture when she had her initial nonweightbearing x-rays   Review of Systems  All other systems reviewed and are negative.       Objective:  Physical Exam Vitals and nursing note reviewed.  Constitutional:      Appearance: She is well-developed and well-nourished.  Cardiovascular:     Pulses: Intact distal pulses.  Pulmonary:     Effort: Pulmonary effort is normal.  Musculoskeletal:        General: Normal range of motion.  Skin:    General: Skin is warm.  Neurological:     Mental Status: She is alert.     Neurovascular status found to be intact muscle strength is adequate negative Denna Haggard' sign noted with significant edema in the lateral side of the right foot and into the right ankle.  Patient has quite a bit of pain around the base of the fifth metatarsal and more proximal from this area and has good digital perfusion well oriented x3     Assessment:  Severe sprain of the right ankle with possibility of fracture or diastases injury     Plan:  H&P reviewed condition and x-rays and at this point applied Unna boot to try to take some of the swelling out and placed her into an air fracture walker to mobilize and discuss possible Tri-Lock ankle bracing in future.  She will use aggressive ice therapy aggressive compression will be seen back as needed depending on response to conservative treatment  X-rays indicate there may be a small fleck fracture around the cuboid but I did not note fracture base of fifth metatarsal fibula or diastases injury

## 2020-03-12 ENCOUNTER — Ambulatory Visit
Admission: RE | Admit: 2020-03-12 | Discharge: 2020-03-12 | Disposition: A | Discharge status: Home / Self Care | Payer: BC Managed Care – PPO | Source: Ambulatory Visit | Attending: Physician Assistant | Admitting: Physician Assistant

## 2020-03-12 DIAGNOSIS — R110 Nausea: Secondary | ICD-10-CM

## 2020-03-12 DIAGNOSIS — R1013 Epigastric pain: Secondary | ICD-10-CM

## 2020-04-01 DIAGNOSIS — Z01812 Encounter for preprocedural laboratory examination: Secondary | ICD-10-CM | POA: Diagnosis not present

## 2020-04-03 DIAGNOSIS — K2940 Chronic atrophic gastritis without bleeding: Secondary | ICD-10-CM | POA: Diagnosis not present

## 2020-04-03 DIAGNOSIS — Z1211 Encounter for screening for malignant neoplasm of colon: Secondary | ICD-10-CM | POA: Diagnosis not present

## 2020-04-03 DIAGNOSIS — R1013 Epigastric pain: Secondary | ICD-10-CM | POA: Diagnosis not present

## 2020-04-03 DIAGNOSIS — K319 Disease of stomach and duodenum, unspecified: Secondary | ICD-10-CM | POA: Diagnosis not present

## 2020-04-03 DIAGNOSIS — K3189 Other diseases of stomach and duodenum: Secondary | ICD-10-CM | POA: Diagnosis not present

## 2020-04-03 DIAGNOSIS — K2990 Gastroduodenitis, unspecified, without bleeding: Secondary | ICD-10-CM | POA: Diagnosis not present

## 2020-04-03 DIAGNOSIS — K31A19 Gastric intestinal metaplasia without dysplasia, unspecified site: Secondary | ICD-10-CM | POA: Diagnosis not present

## 2020-04-03 DIAGNOSIS — K2930 Chronic superficial gastritis without bleeding: Secondary | ICD-10-CM | POA: Diagnosis not present

## 2020-04-29 DIAGNOSIS — Z124 Encounter for screening for malignant neoplasm of cervix: Secondary | ICD-10-CM | POA: Diagnosis not present

## 2020-04-29 DIAGNOSIS — D649 Anemia, unspecified: Secondary | ICD-10-CM | POA: Diagnosis not present

## 2020-04-29 DIAGNOSIS — Z01411 Encounter for gynecological examination (general) (routine) with abnormal findings: Secondary | ICD-10-CM | POA: Diagnosis not present

## 2020-04-29 DIAGNOSIS — Z1231 Encounter for screening mammogram for malignant neoplasm of breast: Secondary | ICD-10-CM | POA: Diagnosis not present

## 2020-04-29 DIAGNOSIS — Z6829 Body mass index (BMI) 29.0-29.9, adult: Secondary | ICD-10-CM | POA: Diagnosis not present

## 2020-04-29 DIAGNOSIS — Z113 Encounter for screening for infections with a predominantly sexual mode of transmission: Secondary | ICD-10-CM | POA: Diagnosis not present

## 2020-04-29 DIAGNOSIS — E039 Hypothyroidism, unspecified: Secondary | ICD-10-CM | POA: Diagnosis not present

## 2020-04-29 DIAGNOSIS — Z01419 Encounter for gynecological examination (general) (routine) without abnormal findings: Secondary | ICD-10-CM | POA: Diagnosis not present

## 2020-05-06 DIAGNOSIS — K900 Celiac disease: Secondary | ICD-10-CM | POA: Diagnosis not present

## 2020-05-06 DIAGNOSIS — K2990 Gastroduodenitis, unspecified, without bleeding: Secondary | ICD-10-CM | POA: Diagnosis not present

## 2020-05-06 DIAGNOSIS — K2940 Chronic atrophic gastritis without bleeding: Secondary | ICD-10-CM | POA: Diagnosis not present

## 2020-05-06 DIAGNOSIS — Z862 Personal history of diseases of the blood and blood-forming organs and certain disorders involving the immune mechanism: Secondary | ICD-10-CM | POA: Diagnosis not present

## 2020-08-12 DIAGNOSIS — K900 Celiac disease: Secondary | ICD-10-CM | POA: Diagnosis not present

## 2020-08-12 DIAGNOSIS — E538 Deficiency of other specified B group vitamins: Secondary | ICD-10-CM | POA: Diagnosis not present

## 2020-08-12 DIAGNOSIS — D509 Iron deficiency anemia, unspecified: Secondary | ICD-10-CM | POA: Diagnosis not present

## 2020-08-12 DIAGNOSIS — E559 Vitamin D deficiency, unspecified: Secondary | ICD-10-CM | POA: Diagnosis not present

## 2020-08-12 DIAGNOSIS — K2940 Chronic atrophic gastritis without bleeding: Secondary | ICD-10-CM | POA: Diagnosis not present

## 2021-03-03 DIAGNOSIS — D649 Anemia, unspecified: Secondary | ICD-10-CM | POA: Diagnosis not present

## 2021-04-28 ENCOUNTER — Encounter: Payer: Self-pay | Admitting: Podiatry

## 2021-04-28 ENCOUNTER — Ambulatory Visit (INDEPENDENT_AMBULATORY_CARE_PROVIDER_SITE_OTHER): Payer: BC Managed Care – PPO

## 2021-04-28 ENCOUNTER — Ambulatory Visit: Payer: BC Managed Care – PPO | Admitting: Podiatry

## 2021-04-28 DIAGNOSIS — S93491A Sprain of other ligament of right ankle, initial encounter: Secondary | ICD-10-CM

## 2021-04-28 DIAGNOSIS — M84374A Stress fracture, right foot, initial encounter for fracture: Secondary | ICD-10-CM | POA: Diagnosis not present

## 2021-04-28 NOTE — Progress Notes (Signed)
Subjective:  ? ?Patient ID: Robin Zavala, female   DOB: 46 y.o.   MRN: [id number]  ? ?HPI ?Patient states she has had some pain in her right foot that is been present for a while but over the last week she has developed severe discomfort on top of the right foot and she did have a event last weekend where she was on her foot constantly and that is when it started to hurt ? ? ?ROS ? ? ?   ?Objective:  ?Physical Exam  ?Neurovascular status intact exquisite discomfort in the fourth metatarsal distal shaft with swelling right mild discomfort in the lesser MPJs ? ?   ?Assessment:  ?Probability for stress fracture of the right fourth metatarsal distal shaft ? ?   ?Plan:  ?H&P reviewed condition reviewed x-ray and at this point applied air fracture walker with instructions on usage.  Patient will be seen back for [place] to recheck in 3 weeks and will utilize ice and elevation as possible ? ?X-rays indicate suspicious sign in the neck of the fourth metatarsal right most likely indicating stress fracture ?   ? ? ?

## 2021-05-18 DIAGNOSIS — F419 Anxiety disorder, unspecified: Secondary | ICD-10-CM | POA: Diagnosis not present

## 2021-05-18 DIAGNOSIS — Z01411 Encounter for gynecological examination (general) (routine) with abnormal findings: Secondary | ICD-10-CM | POA: Diagnosis not present

## 2021-05-18 DIAGNOSIS — Z1329 Encounter for screening for other suspected endocrine disorder: Secondary | ICD-10-CM | POA: Diagnosis not present

## 2021-05-18 DIAGNOSIS — Z1231 Encounter for screening mammogram for malignant neoplasm of breast: Secondary | ICD-10-CM | POA: Diagnosis not present

## 2021-05-18 DIAGNOSIS — Z6829 Body mass index (BMI) 29.0-29.9, adult: Secondary | ICD-10-CM | POA: Diagnosis not present

## 2021-05-18 DIAGNOSIS — Z124 Encounter for screening for malignant neoplasm of cervix: Secondary | ICD-10-CM | POA: Diagnosis not present

## 2021-05-18 DIAGNOSIS — Z01419 Encounter for gynecological examination (general) (routine) without abnormal findings: Secondary | ICD-10-CM | POA: Diagnosis not present

## 2021-05-18 DIAGNOSIS — E039 Hypothyroidism, unspecified: Secondary | ICD-10-CM | POA: Diagnosis not present

## 2021-05-18 DIAGNOSIS — Z0142 Encounter for cervical smear to confirm findings of recent normal smear following initial abnormal smear: Secondary | ICD-10-CM | POA: Diagnosis not present

## 2021-05-19 ENCOUNTER — Ambulatory Visit: Payer: BC Managed Care – PPO | Admitting: Podiatry

## 2021-09-07 DIAGNOSIS — R109 Unspecified abdominal pain: Secondary | ICD-10-CM | POA: Diagnosis not present

## 2021-09-24 DIAGNOSIS — G43909 Migraine, unspecified, not intractable, without status migrainosus: Secondary | ICD-10-CM | POA: Diagnosis not present

## 2021-09-24 DIAGNOSIS — F419 Anxiety disorder, unspecified: Secondary | ICD-10-CM | POA: Diagnosis not present

## 2021-11-10 DIAGNOSIS — R051 Acute cough: Secondary | ICD-10-CM | POA: Diagnosis not present

## 2021-11-10 DIAGNOSIS — B349 Viral infection, unspecified: Secondary | ICD-10-CM | POA: Diagnosis not present

## 2021-12-15 DIAGNOSIS — R509 Fever, unspecified: Secondary | ICD-10-CM | POA: Diagnosis not present

## 2021-12-15 DIAGNOSIS — J020 Streptococcal pharyngitis: Secondary | ICD-10-CM | POA: Diagnosis not present

## 2022-03-25 DIAGNOSIS — Z13220 Encounter for screening for lipoid disorders: Secondary | ICD-10-CM | POA: Diagnosis not present

## 2022-03-25 DIAGNOSIS — K900 Celiac disease: Secondary | ICD-10-CM | POA: Diagnosis not present

## 2022-03-25 DIAGNOSIS — Z862 Personal history of diseases of the blood and blood-forming organs and certain disorders involving the immune mechanism: Secondary | ICD-10-CM | POA: Diagnosis not present

## 2022-03-25 DIAGNOSIS — Z23 Encounter for immunization: Secondary | ICD-10-CM | POA: Diagnosis not present

## 2022-03-25 DIAGNOSIS — E559 Vitamin D deficiency, unspecified: Secondary | ICD-10-CM | POA: Diagnosis not present

## 2022-03-25 DIAGNOSIS — E538 Deficiency of other specified B group vitamins: Secondary | ICD-10-CM | POA: Diagnosis not present

## 2022-03-25 DIAGNOSIS — G43909 Migraine, unspecified, not intractable, without status migrainosus: Secondary | ICD-10-CM | POA: Diagnosis not present

## 2022-03-25 DIAGNOSIS — F419 Anxiety disorder, unspecified: Secondary | ICD-10-CM | POA: Diagnosis not present

## 2022-03-25 DIAGNOSIS — E039 Hypothyroidism, unspecified: Secondary | ICD-10-CM | POA: Diagnosis not present

## 2022-03-25 DIAGNOSIS — Z0000 Encounter for general adult medical examination without abnormal findings: Secondary | ICD-10-CM | POA: Diagnosis not present

## 2022-06-01 DIAGNOSIS — Z1231 Encounter for screening mammogram for malignant neoplasm of breast: Secondary | ICD-10-CM | POA: Diagnosis not present

## 2022-06-01 DIAGNOSIS — N951 Menopausal and female climacteric states: Secondary | ICD-10-CM | POA: Diagnosis not present

## 2022-06-01 DIAGNOSIS — M545 Low back pain, unspecified: Secondary | ICD-10-CM | POA: Diagnosis not present

## 2022-06-01 DIAGNOSIS — Z01419 Encounter for gynecological examination (general) (routine) without abnormal findings: Secondary | ICD-10-CM | POA: Diagnosis not present

## 2022-06-01 DIAGNOSIS — Z124 Encounter for screening for malignant neoplasm of cervix: Secondary | ICD-10-CM | POA: Diagnosis not present

## 2022-12-05 DIAGNOSIS — M9901 Segmental and somatic dysfunction of cervical region: Secondary | ICD-10-CM | POA: Diagnosis not present

## 2022-12-05 DIAGNOSIS — M9903 Segmental and somatic dysfunction of lumbar region: Secondary | ICD-10-CM | POA: Diagnosis not present

## 2022-12-05 DIAGNOSIS — M9907 Segmental and somatic dysfunction of upper extremity: Secondary | ICD-10-CM | POA: Diagnosis not present

## 2022-12-05 DIAGNOSIS — M9902 Segmental and somatic dysfunction of thoracic region: Secondary | ICD-10-CM | POA: Diagnosis not present

## 2023-04-06 DIAGNOSIS — E538 Deficiency of other specified B group vitamins: Secondary | ICD-10-CM | POA: Diagnosis not present

## 2023-04-06 DIAGNOSIS — E559 Vitamin D deficiency, unspecified: Secondary | ICD-10-CM | POA: Diagnosis not present

## 2023-04-06 DIAGNOSIS — E7800 Pure hypercholesterolemia, unspecified: Secondary | ICD-10-CM | POA: Diagnosis not present

## 2023-04-06 DIAGNOSIS — E039 Hypothyroidism, unspecified: Secondary | ICD-10-CM | POA: Diagnosis not present

## 2023-04-06 DIAGNOSIS — Z0000 Encounter for general adult medical examination without abnormal findings: Secondary | ICD-10-CM | POA: Diagnosis not present

## 2023-04-06 DIAGNOSIS — K900 Celiac disease: Secondary | ICD-10-CM | POA: Diagnosis not present

## 2023-04-06 DIAGNOSIS — D509 Iron deficiency anemia, unspecified: Secondary | ICD-10-CM | POA: Diagnosis not present

## 2023-10-17 DIAGNOSIS — Z01419 Encounter for gynecological examination (general) (routine) without abnormal findings: Secondary | ICD-10-CM | POA: Diagnosis not present

## 2023-10-17 DIAGNOSIS — N951 Menopausal and female climacteric states: Secondary | ICD-10-CM | POA: Diagnosis not present

## 2023-10-17 DIAGNOSIS — Z124 Encounter for screening for malignant neoplasm of cervix: Secondary | ICD-10-CM | POA: Diagnosis not present

## 2023-10-17 DIAGNOSIS — Z6829 Body mass index (BMI) 29.0-29.9, adult: Secondary | ICD-10-CM | POA: Diagnosis not present

## 2023-10-17 DIAGNOSIS — Z1231 Encounter for screening mammogram for malignant neoplasm of breast: Secondary | ICD-10-CM | POA: Diagnosis not present

## 2023-10-25 ENCOUNTER — Other Ambulatory Visit: Payer: Self-pay | Admitting: Obstetrics and Gynecology

## 2023-10-25 DIAGNOSIS — R928 Other abnormal and inconclusive findings on diagnostic imaging of breast: Secondary | ICD-10-CM

## 2023-11-03 ENCOUNTER — Ambulatory Visit
Admission: RE | Admit: 2023-11-03 | Discharge: 2023-11-03 | Disposition: A | Discharge status: Home / Self Care | Source: Ambulatory Visit | Attending: Obstetrics and Gynecology | Admitting: Obstetrics and Gynecology

## 2023-11-03 DIAGNOSIS — R928 Other abnormal and inconclusive findings on diagnostic imaging of breast: Secondary | ICD-10-CM

## 2023-11-03 DIAGNOSIS — N6489 Other specified disorders of breast: Secondary | ICD-10-CM | POA: Diagnosis not present
